# Patient Record
Sex: Male | Born: 1985 | Race: White | Hispanic: No | Marital: Single | Smoking: Current every day smoker
Health system: Southern US, Community
[De-identification: ages and names within clinical notes are randomized; demographics above are authoritative.]

## PROBLEM LIST (undated history)

## (undated) DIAGNOSIS — J45909 Unspecified asthma, uncomplicated: Secondary | ICD-10-CM

## (undated) HISTORY — PX: TONSILLECTOMY: SUR1361

---

## 2017-06-11 ENCOUNTER — Emergency Department (HOSPITAL_COMMUNITY)
Admission: EM | Admit: 2017-06-11 | Discharge: 2017-06-11 | Disposition: A | Discharge status: Home / Self Care | Payer: Medicaid Other | Attending: Emergency Medicine | Admitting: Emergency Medicine

## 2017-06-11 ENCOUNTER — Emergency Department (HOSPITAL_COMMUNITY): Payer: Medicaid Other

## 2017-06-11 ENCOUNTER — Encounter (HOSPITAL_COMMUNITY): Payer: Self-pay

## 2017-06-11 DIAGNOSIS — F17200 Nicotine dependence, unspecified, uncomplicated: Secondary | ICD-10-CM | POA: Diagnosis not present

## 2017-06-11 DIAGNOSIS — J45909 Unspecified asthma, uncomplicated: Secondary | ICD-10-CM | POA: Insufficient documentation

## 2017-06-11 DIAGNOSIS — R079 Chest pain, unspecified: Secondary | ICD-10-CM | POA: Diagnosis not present

## 2017-06-11 HISTORY — DX: Unspecified asthma, uncomplicated: J45.909

## 2017-06-11 LAB — BASIC METABOLIC PANEL
ANION GAP: 7 (ref 5–15)
BUN: 10 mg/dL (ref 6–20)
CO2: 25 mmol/L (ref 22–32)
Calcium: 9.1 mg/dL (ref 8.9–10.3)
Chloride: 106 mmol/L (ref 101–111)
Creatinine, Ser: 0.9 mg/dL (ref 0.61–1.24)
GFR calc Af Amer: >60 mL/min (ref >60)
GFR calc non Af Amer: >60 mL/min (ref >60)
Glucose, Bld: 97 mg/dL (ref 65–99)
POTASSIUM: 4.5 mmol/L (ref 3.5–5.1)
SODIUM: 138 mmol/L (ref 135–145)

## 2017-06-11 LAB — CBC
HEMATOCRIT: 43.9 % (ref 39.0–52.0)
Hemoglobin: 14.8 g/dL (ref 13.0–17.0)
MCH: 30.5 pg (ref 26.0–34.0)
MCHC: 33.7 g/dL (ref 30.0–36.0)
MCV: 90.5 fL (ref 78.0–100.0)
Platelets: 266 10*3/uL (ref 150–400)
RBC: 4.85 MIL/uL (ref 4.22–5.81)
RDW: 13.3 % (ref 11.5–15.5)
WBC: 7.3 10*3/uL (ref 4.0–10.5)

## 2017-06-11 LAB — I-STAT TROPONIN, ED: Troponin i, poc: 0 ng/mL (ref 0.00–0.08)

## 2017-06-11 MED ORDER — IPRATROPIUM-ALBUTEROL 0.5-2.5 (3) MG/3ML IN SOLN
3.0000 mL | Freq: Once | RESPIRATORY_TRACT | Status: AC
  Administered 2017-06-11: 3 mL via RESPIRATORY_TRACT
  Filled 2017-06-11: qty 3

## 2017-06-11 MED ORDER — IBUPROFEN 600 MG PO TABS: 600.0000 mg | ORAL_TABLET | Freq: Four times a day (QID) | ORAL | 0 refills | Status: AC | PRN

## 2017-06-11 MED ORDER — IBUPROFEN 400 MG PO TABS
600.0000 mg | ORAL_TABLET | Freq: Once | ORAL | Status: AC
  Administered 2017-06-11: 600 mg via ORAL
  Filled 2017-06-11: qty 1

## 2017-06-11 NOTE — Discharge Instructions (Addendum)
Please read and follow all provided instructions.  Your diagnoses today include:  1. Chest pain, unspecified type     Tests performed today include: An EKG of your heart A chest x-ray Cardiac enzymes - a blood test for heart muscle damage Blood counts and electrolytes Vital signs. See below for your results today.   Medications prescribed:   Take any prescribed medications only as directed.  Follow-up instructions: Please follow-up with your primary care provider as soon as you can for further evaluation of your symptoms.   Return instructions:  SEEK IMMEDIATE MEDICAL ATTENTION IF: You have severe chest pain, especially if the pain is crushing or pressure-like and spreads to the arms, back, neck, or jaw, or if you have sweating, nausea (feeling sick to your stomach), or shortness of breath. THIS IS AN EMERGENCY. Don't wait to see if the pain will go away. Get medical help at once. Call 911 or 0 (operator). DO NOT drive yourself to the hospital.  Your chest pain gets worse and does not go away with rest.  You have an attack of chest pain lasting longer than usual, despite rest and treatment with the medications your caregiver has prescribed.  You wake from sleep with chest pain or shortness of breath. You feel dizzy or faint. You have chest pain not typical of your usual pain for which you originally saw your caregiver.  You have any other emergent concerns regarding your health.  Additional Information: Chest pain comes from many different causes. Your caregiver has diagnosed you as having chest pain that is not specific for one problem, but does not require admission.  You are at low risk for an acute heart condition or other serious illness.   Your vital signs today were: BP 112/78    Pulse (!) 52    Temp 97.6 F (36.4 C) (Oral)    Resp 16    Ht 6\' 2"  (1.88 m)    Wt 71.7 kg (158 lb)    SpO2 100%    BMI 20.29 kg/m  If your blood pressure (BP) was elevated above 135/85 this  visit, please have this repeated by your doctor within one month. --------------

## 2017-06-11 NOTE — ED Triage Notes (Signed)
Pt states left side ribcage pain X3 days. He report pain is worse when laying down or with exertion. Sent here for follow up by PCP for possible pericarditis. No distress noted.

## 2017-06-11 NOTE — ED Notes (Signed)
Gave pt urinal 

## 2017-06-11 NOTE — ED Provider Notes (Signed)
[facility]-EMERGENCY DEPT Provider Note   CSN: [id number] Arrival date & time: 06/11/17  0945     History   Chief Complaint Chief Complaint  Patient presents with  . Chest Pain    HPI Justin Valenzuela is a 31 y.o. male.  HPI  31 y.o. male with a hx of Asthma, presents to the Emergency Department today due to left sided sided pain x 3 days. No pain currently. Notes located left ribs around mid clavicular line. States pain is constant and worsens with position. Lying makes it worse. 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 Denies trauma to area. Described as sharp pain. No shortness of breath. No radiation. No diaphoresis. No hx ACS. Pt sent from urgent care due to possible pericarditis. Notes cough and URI symptoms last week that have resolved. No abdominal pain. No fevers. No meds PTA. No other symptoms noted.   Past Medical History:  Diagnosis Date  . Asthma     There are no active problems to display for this patient.   Past Surgical History:  Procedure Laterality Date  . TONSILLECTOMY       Home Medications    Prior to Admission medications   Not on File    Family History History reviewed. No pertinent family history.  Social History Social History  Substance Use Topics  . Smoking status: Current Every Day Smoker    Packs/day: 1.00  . Smokeless tobacco: Never Used  . Alcohol use No     Allergies   Penicillins   Review of Systems Review of Systems ROS reviewed and all are negative for acute change except as noted in the HPI.  Physical Exam Updated Vital Signs BP 114/71 (BP Location: Right Arm)   Pulse (!) 54   Temp 97.6 F (36.4 C) (Oral)   Resp 18   Ht 6\' 2"  (1.88 m)   Wt 71.7 kg (158 lb)   SpO2 100%   BMI 20.29 kg/m   Physical Exam  Constitutional: He is oriented to person, place, and time. Vital signs are normal. He appears well-developed and well-nourished.  HENT:  Head: Normocephalic and atraumatic.  Right  Ear: Hearing normal.  Left Ear: Hearing normal.  Eyes: Conjunctivae and EOM are normal. Pupils are equal, round, and reactive to light.  Neck: Normal range of motion. Neck supple.  Cardiovascular: Normal rate, regular rhythm, normal heart sounds and intact distal pulses.   Pulmonary/Chest: Effort normal. He has wheezes in the right upper field and the left upper field.  Abdominal: There is no tenderness.  Musculoskeletal: Normal range of motion.  Neurological: He is alert and oriented to person, place, and time.  Skin: Skin is warm and dry.  Psychiatric: He has a normal mood and affect. His speech is normal and behavior is normal. Thought content normal.  Nursing note and vitals reviewed.  ED Treatments / Results  Labs (all labs ordered are listed, but only abnormal results are displayed) Labs Reviewed  BASIC METABOLIC PANEL  CBC  I-STAT TROPOININ, ED   EKG  EKG Interpretation  Date/Time:  Tuesday June 11 2017 09:56:38 EDT Ventricular Rate:  60 PR Interval:  122 QRS Duration: 94 QT Interval:  428 QTC Calculation: 428 R Axis:   85 Text Interpretation:  Normal sinus rhythm with sinus arrhythmia Early repolarization Normal ECG No old tracing to compare Confirmed by Mancel BaleWentz, Elliott [id number]) on 06/11/2017 2:03:49 PM      Radiology Dg Chest 2 View  Result Date: 06/11/2017 CLINICAL DATA:  Left lateral chest and rib pain for 3-4 days. EXAM: CHEST  2 VIEW COMPARISON:  None. FINDINGS: The lungs are clear without focal pneumonia, edema, pneumothorax or pleural effusion. The cardiopericardial silhouette is within normal limits for size. The visualized bony structures of the thorax are intact. Nodular density/densities projecting over the lungs are compatible with pads for telemetry leads. IMPRESSION: Normal chest x-ray. Electronically Signed   By: Kennith Center M.D.   On: 06/11/2017 11:08    Procedures Procedures (including critical care time)  Medications Ordered in ED Medications    ipratropium-albuterol (DUONEB) 0.5-2.5 (3) MG/3ML nebulizer solution 3 mL (3 mLs Nebulization Given 06/11/17 1315)  ibuprofen (ADVIL,MOTRIN) tablet 600 mg (600 mg Oral Given 06/11/17 1315)     Initial Impression / Assessment and Plan / ED Course  I have reviewed the triage vital signs and the nursing notes.  Pertinent labs & imaging results that were available during my care of the patient were reviewed by me and considered in my medical decision making (see chart for details).  Final Clinical Impressions(s) / ED Diagnoses  {I have reviewed and evaluated the relevant laboratory values. {I have reviewed and evaluated the relevant imaging studies. {I have interpreted the relevant EKG. {I have reviewed the relevant previous healthcare records.  {I obtained HPI from historian. {Patient discussed with supervising physician.  ED Course:  Assessment: Pt is a 31 y.o. male presents with CP x 3 days. No diaphoresis. No N/V. No radiation. Seen by Urgent Care with Dx likely Pericarditis and sent to ED. Risk Factors for ACS: None. Given motrin and neb treatment in ED. Patient is to be discharged with recommendation to follow up with PCP in regards to today's hospital visit. Perc negative, VSS, no tracheal deviation, no JVD or new murmur, RRR, breath sounds equal bilaterally, EKG without acute abnormalities. Possible diffuse ST elevation suggestive of pericarditis, but there is no PR depression. Likely early repolarization. Exam not suggestive of pericarditis. Negative troponin, and negative CXR. Heart Score 0. Pt has been advised to return to the ED is CP becomes exertional, associated with diaphoresis or nausea, radiates to left jaw/arm, worsens or becomes concerning in any way. Pt appears reliable for follow up and is agreeable to discharge. Patient is in no acute distress. Vital Signs are stable. Patient is able to ambulate. Patient able to tolerate PO.   Disposition/Plan:  DC Home Additional Verbal discharge  instructions given and discussed with patient.  Pt Instructed to f/u with PCP in the next week for evaluation and treatment of symptoms. Return precautions given Pt acknowledges and agrees with plan  Supervising Physician Mancel Bale, MD  Final diagnoses:  Chest pain, unspecified type    New Prescriptions New Prescriptions   No medications on file     Audry Pili, Cordelia Poche 06/11/17 1408    Mancel Bale, MD 06/11/17 1754

## 2019-03-14 IMAGING — DX DG CHEST 2V
2 series · 2 of 2 positions shown · non-contrast
Comparison: None.

CLINICAL DATA: Left lateral chest and rib pain for 3-4 days.

EXAM:
CHEST  2 VIEW

[w chest pa]
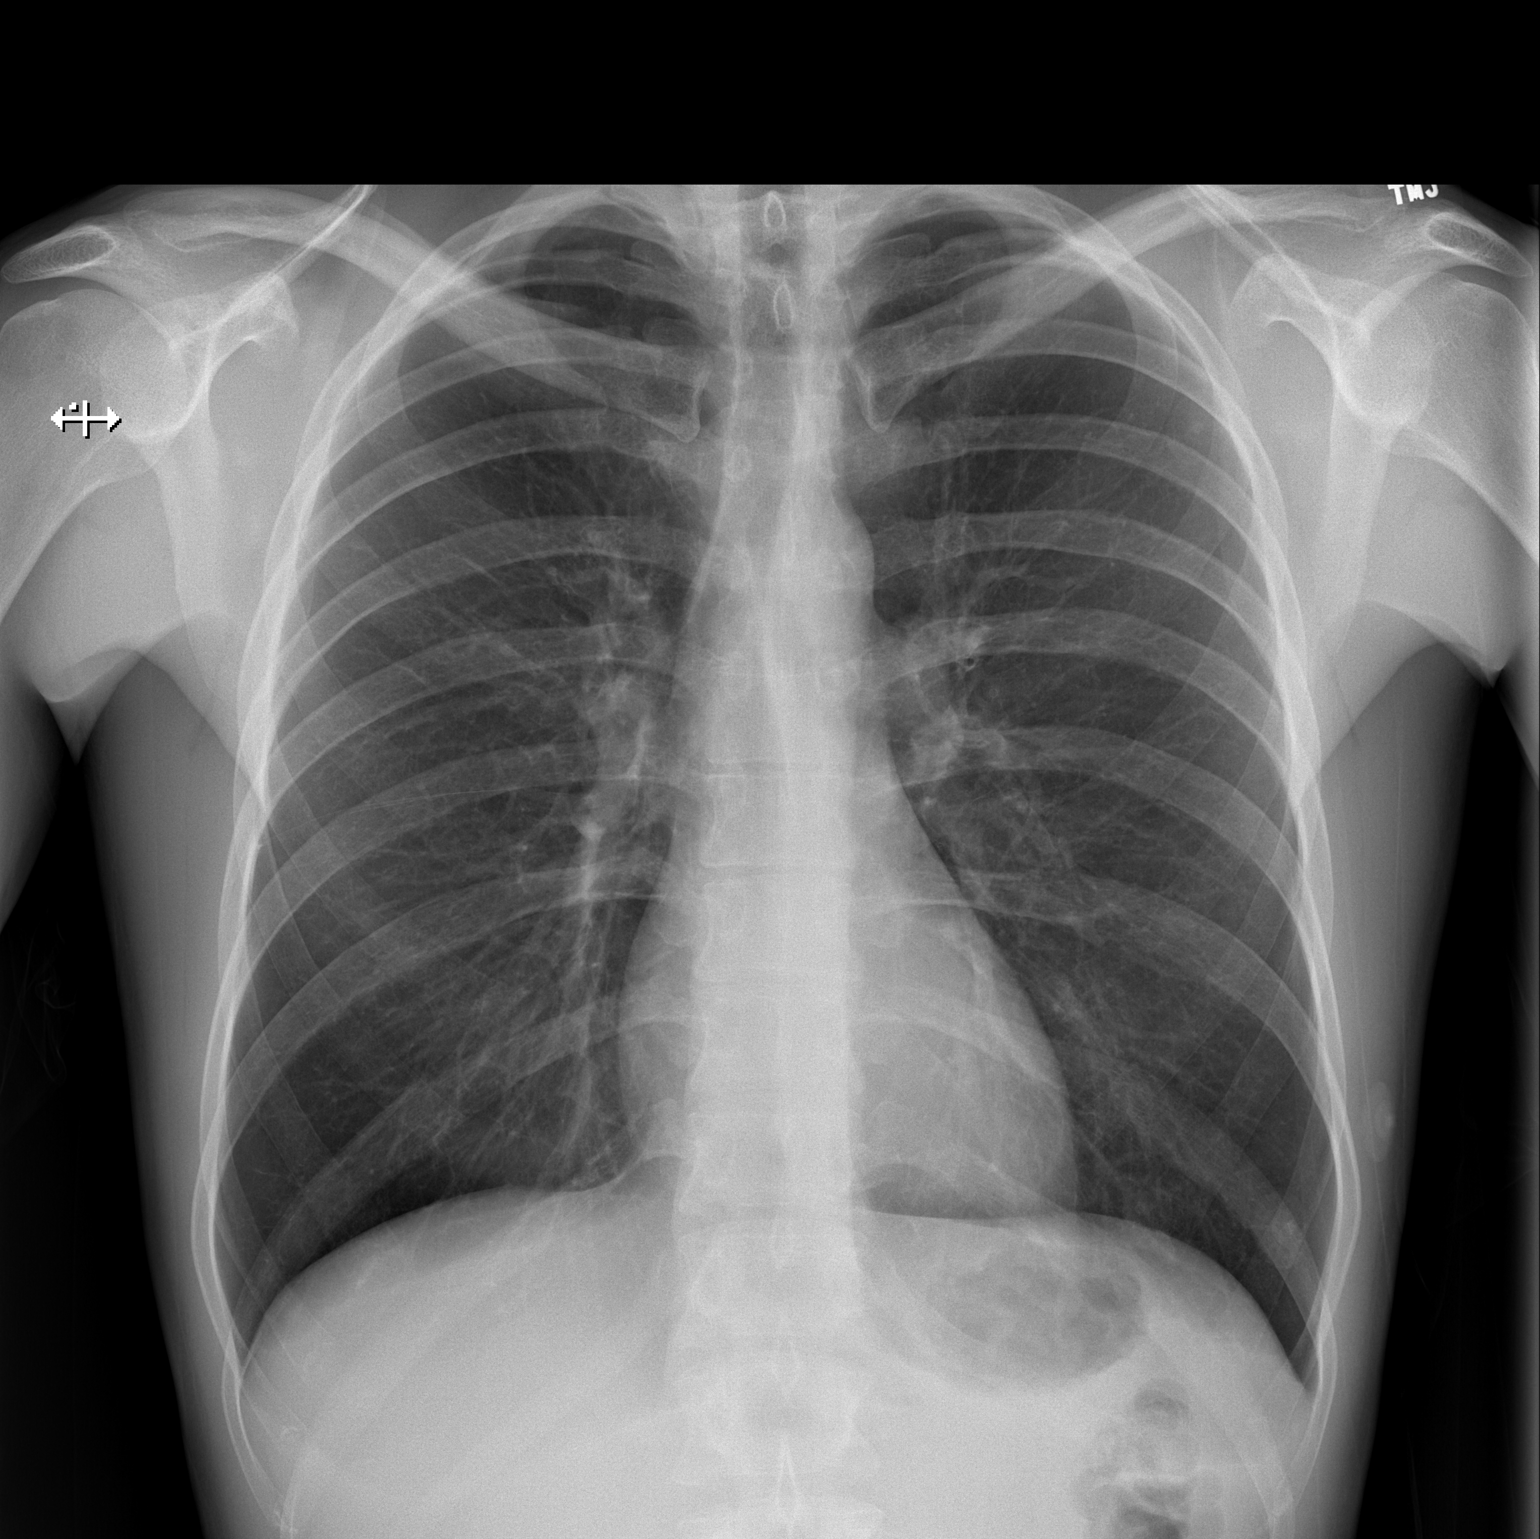

[w chest lat]
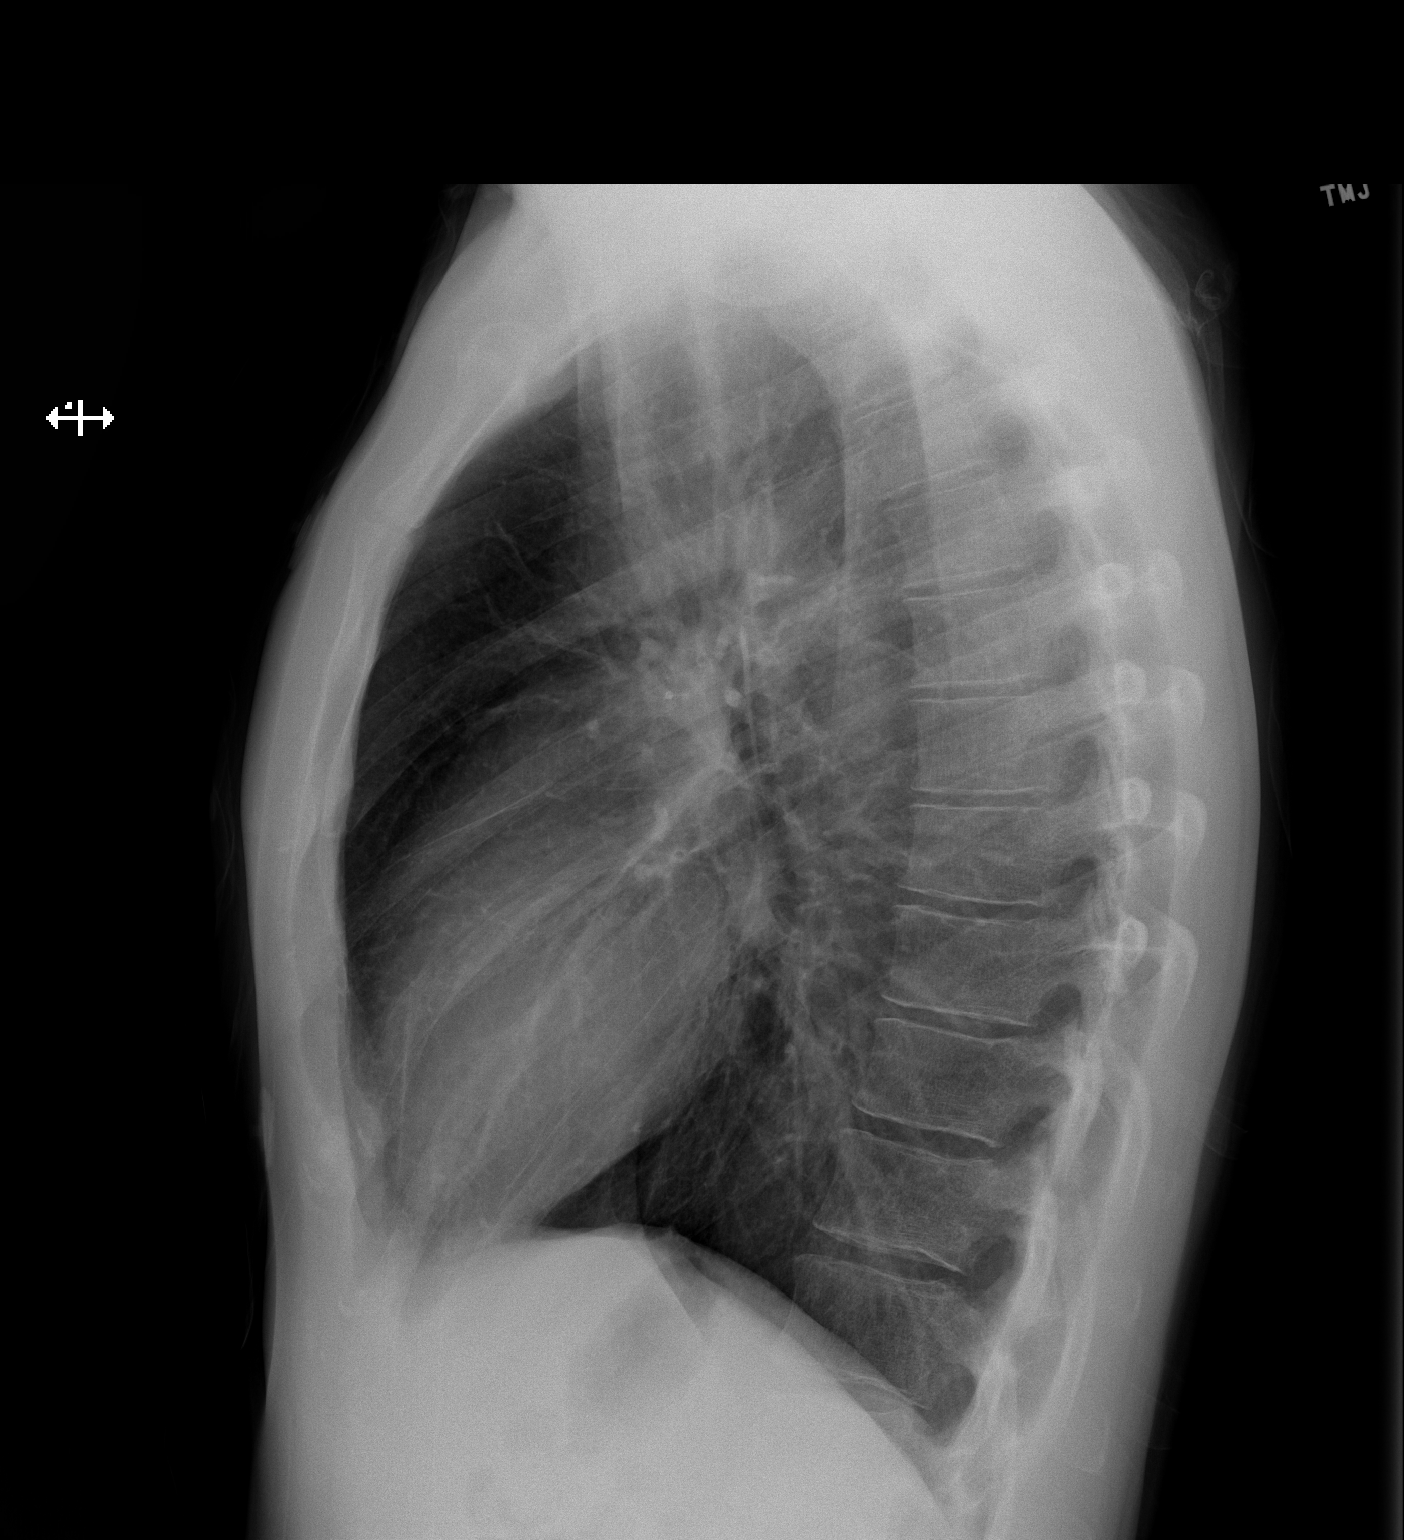

[2 of 2 positions shown; findings below may reference images not displayed]

FINDINGS: The lungs are clear without focal pneumonia, edema, pneumothorax or
pleural effusion. The cardiopericardial silhouette is within normal
limits for size. The visualized bony structures of the thorax are
intact. Nodular density/densities projecting over the lungs are
compatible with pads for telemetry leads.
IMPRESSION: Normal chest x-ray.

## 2022-10-20 ENCOUNTER — Other Ambulatory Visit: Payer: Self-pay

## 2022-10-20 ENCOUNTER — Emergency Department
Admission: EM | Admit: 2022-10-20 | Discharge: 2022-10-20 | Disposition: A | Discharge status: Home / Self Care | Payer: Medicaid Other | Attending: Emergency Medicine | Admitting: Emergency Medicine

## 2022-10-20 ENCOUNTER — Emergency Department: Payer: Medicaid Other

## 2022-10-20 ENCOUNTER — Encounter: Payer: Self-pay | Admitting: Emergency Medicine

## 2022-10-20 DIAGNOSIS — F17200 Nicotine dependence, unspecified, uncomplicated: Secondary | ICD-10-CM | POA: Diagnosis not present

## 2022-10-20 DIAGNOSIS — R0789 Other chest pain: Secondary | ICD-10-CM | POA: Diagnosis present

## 2022-10-20 DIAGNOSIS — R0602 Shortness of breath: Secondary | ICD-10-CM | POA: Insufficient documentation

## 2022-10-20 DIAGNOSIS — R001 Bradycardia, unspecified: Secondary | ICD-10-CM | POA: Diagnosis not present

## 2022-10-20 DIAGNOSIS — R079 Chest pain, unspecified: Secondary | ICD-10-CM

## 2022-10-20 LAB — HEPATIC FUNCTION PANEL
ALT: 19 U/L (ref 0–44)
AST: 20 U/L (ref 15–41)
Albumin: 4.4 g/dL (ref 3.5–5.0)
Alkaline Phosphatase: 55 U/L (ref 38–126)
Bilirubin, Direct: <0.1 mg/dL (ref 0.0–0.2)
Total Bilirubin: 0.7 mg/dL (ref 0.3–1.2)
Total Protein: 7 g/dL (ref 6.5–8.1)

## 2022-10-20 LAB — BASIC METABOLIC PANEL
Anion gap: 5 (ref 5–15)
BUN: 19 mg/dL (ref 6–20)
CO2: 28 mmol/L (ref 22–32)
Calcium: 9.2 mg/dL (ref 8.9–10.3)
Chloride: 107 mmol/L (ref 98–111)
Creatinine, Ser: 1.05 mg/dL (ref 0.61–1.24)
GFR, Estimated: >60 mL/min (ref >60)
Glucose, Bld: 98 mg/dL (ref 70–99)
Potassium: 4.5 mmol/L (ref 3.5–5.1)
Sodium: 140 mmol/L (ref 135–145)

## 2022-10-20 LAB — CBC
HCT: 44.4 % (ref 39.0–52.0)
Hemoglobin: 14.6 g/dL (ref 13.0–17.0)
MCH: 29.5 pg (ref 26.0–34.0)
MCHC: 32.9 g/dL (ref 30.0–36.0)
MCV: 89.7 fL (ref 80.0–100.0)
Platelets: 290 10*3/uL (ref 150–400)
RBC: 4.95 MIL/uL (ref 4.22–5.81)
RDW: 13.1 % (ref 11.5–15.5)
WBC: 11.1 10*3/uL — ABNORMAL HIGH (ref 4.0–10.5)
nRBC: 0 % (ref 0.0–0.2)

## 2022-10-20 LAB — LIPASE, BLOOD: Lipase: 59 U/L — ABNORMAL HIGH (ref 11–51)

## 2022-10-20 LAB — TROPONIN I (HIGH SENSITIVITY)
Troponin I (High Sensitivity): <2 ng/L (ref <18)
Troponin I (High Sensitivity): <2 ng/L (ref <18)

## 2022-10-20 MED ORDER — KETOROLAC TROMETHAMINE 30 MG/ML IJ SOLN
30.0000 mg | Freq: Once | INTRAMUSCULAR | Status: AC
  Administered 2022-10-20: 30 mg via INTRAMUSCULAR
  Filled 2022-10-20: qty 1

## 2022-10-20 NOTE — Discharge Instructions (Signed)
Take Naproxen twice daily for ten days.  

## 2022-10-20 NOTE — ED Triage Notes (Signed)
Pt reports chest pain constantly for the last 3 days that is heavy like in nature. Pt reports slight SOB but denies all other sx's.

## 2022-10-20 NOTE — ED Notes (Signed)
See triage note. Heaviness to chest since 3 days. Unlabored respirations.

## 2022-10-20 NOTE — ED Provider Notes (Signed)
[facility] Provider Note  Patient Contact: 3:11 PM (approximate)   History   Chest Pain   HPI  Justin Valenzuela is a 36 y.o. 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  Patient reports that he has been symptomatic with this chest pain for the past 3 days with some mild associated shortness of breath just when patient experiences the pain.  He states that pain is reproducible with palpation.  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  He does not remember experiencing similar pain in the past.  He denies falls or mechanisms of trauma.  He has not started any new medications.  Denies a prodrome of viral URI-like symptoms.  Denies fever or chills at home.  Patient denies known history of cardiac issues aside from bradycardia.  No extensive family history of cardiac issues.  Patient is a daily smoker.  No nausea, vomiting or diarrhea.      Physical Exam   Triage Vital Signs: ED Triage Vitals  Enc Vitals Group     BP 10/20/22 1332 117/77     Pulse Rate 10/20/22 1332 (!) 56     Resp 10/20/22 1332 20     Temp 10/20/22 1332 (!) 97.4 F (36.3 C)     Temp src --      SpO2 10/20/22 1332 98 %     Weight 10/20/22 1259 170 lb (77.1 kg)     Height 10/20/22 1259 6\' 2"  (1.88 m)     Head Circumference --      Peak Flow --      Pain Score 10/20/22 1259 5     Pain Loc --      Pain Edu? --      Excl. in GC? --     Most recent vital signs: Vitals:   10/20/22 1600 10/20/22 1700  BP: 121/77 121/70  Pulse: (!) 58 (!) 58  Resp: 16 15  Temp:    SpO2: 99% 97%     General: Alert and in no acute distress. Eyes:  PERRL. EOMI. Head: No acute traumatic findings ENT:      Nose: No congestion/rhinnorhea.      Mouth/Throat: Mucous membranes are moist. Neck: No stridor. No cervical spine tenderness to palpation. Cardiovascular:  Good peripheral perfusion.  Patient has left-sided anterior  chest wall pain to palpation. Respiratory: Normal respiratory effort without tachypnea or retractions. Lungs CTAB. Good air entry to the bases with no decreased or absent breath sounds. Gastrointestinal: Bowel sounds 4 quadrants. Soft and nontender to palpation. No guarding or rigidity. No palpable masses. No distention. No CVA tenderness. Musculoskeletal: Full range of motion to all extremities.  Neurologic:  No gross focal neurologic deficits are appreciated.  Skin:   No rash noted Other:   ED Results / Procedures / Treatments   Labs (all labs ordered are listed, but only abnormal results are displayed) Labs Reviewed  CBC - Abnormal; Notable for the following components:      Result Value   WBC 11.1 (*)    All other components within normal limits  LIPASE, BLOOD - Abnormal; Notable for the following components:   Lipase 59 (*)    All other components within normal limits  BASIC METABOLIC PANEL  HEPATIC FUNCTION PANEL  TROPONIN I (HIGH SENSITIVITY)  TROPONIN I (HIGH SENSITIVITY)     EKG  Sinus bradycardia with early repolarization.   RADIOLOGY  I personally  viewed and evaluated these images as part of my medical decision making, as well as reviewing the written report by the radiologist.  ED Provider Interpretation: No consolidations, opacities or infiltrates.  No evidence of pneumothorax or pneumomediastinum.   PROCEDURES:  Critical Care performed: No  Procedures   MEDICATIONS ORDERED IN ED: Medications  ketorolac (TORADOL) 30 MG/ML injection 30 mg (30 mg Intramuscular Given 10/20/22 1633)     IMPRESSION / MDM / ASSESSMENT AND PLAN / ED COURSE  I reviewed the triage vital signs and the nursing notes.                              Assessment and plan Nonspecific chest pain 36 year old male with history of bradycardia, presents to the emergency department with left-sided chest pain.  Patient was bradycardic at triage but vital signs were otherwise  reassuring.  On exam, patient was resting supine and appeared comfortable.  I reviewed patient's past medical history and see that he was seen for similar complaints in 2018, also with reproducible chest pain.  On exam, patient's left-sided chest wall was tender to palpation in the midclavicular line with good breath sounds in the lung bases without other adventitious lung sounds.  EKG indicated sinus bradycardia with early repolarization  CBC and BMP were reassuring.  Initial troponin within range.  Chest x-ray shows no evidence of consolidation, opacity or infiltrate.  No pneumothorax or pneumomediastinum.  Care plan includes obtaining second troponin.  If second troponin in range, will give IM Toradol and treat patient with naproxen at home, likely for costochondritis.   Second troponin within range.  Patient received his injection of Toradol and felt improved.  Recommended naproxen as mentioned above.  Return precautions were given to return with new or worsening symptoms.   FINAL CLINICAL IMPRESSION(S) / ED DIAGNOSES   Final diagnoses:  Chest pain, unspecified type     Rx / DC Orders   ED Discharge Orders     None        Note:  This document was prepared using Dragon voice recognition software and may include unintentional dictation errors.   Pia Mau [place], PA-C 10/20/22 1843    Shaune Pollack, MD 10/20/22 2157
